# Patient Record
Sex: Female | Born: 1955 | Race: White | Hispanic: No | Marital: Married | Smoking: Former smoker
Health system: Southern US, Community
[De-identification: ages and names within clinical notes are randomized; demographics above are authoritative.]

## PROBLEM LIST (undated history)

## (undated) DIAGNOSIS — M79609 Pain in unspecified limb: Secondary | ICD-10-CM

## (undated) DIAGNOSIS — R202 Paresthesia of skin: Secondary | ICD-10-CM

## (undated) DIAGNOSIS — G629 Polyneuropathy, unspecified: Secondary | ICD-10-CM

## (undated) DIAGNOSIS — M47816 Spondylosis without myelopathy or radiculopathy, lumbar region: Secondary | ICD-10-CM

## (undated) DIAGNOSIS — D472 Monoclonal gammopathy: Secondary | ICD-10-CM

## (undated) DIAGNOSIS — R7309 Other abnormal glucose: Secondary | ICD-10-CM

## (undated) DIAGNOSIS — E7800 Pure hypercholesterolemia, unspecified: Secondary | ICD-10-CM

## (undated) HISTORY — PX: BACK SURGERY: SHX140

## (undated) HISTORY — DX: Spondylosis without myelopathy or radiculopathy, lumbar region: M47.816

## (undated) HISTORY — DX: Monoclonal gammopathy: D47.2

## (undated) HISTORY — PX: TONSILLECTOMY: SUR1361

## (undated) HISTORY — DX: Paresthesia of skin: R20.2

## (undated) HISTORY — DX: Other abnormal glucose: R73.09

## (undated) HISTORY — DX: Pain in unspecified limb: M79.609

## (undated) HISTORY — PX: LUMBAR FUSION: SHX111

## (undated) HISTORY — DX: Pure hypercholesterolemia, unspecified: E78.00

## (undated) HISTORY — DX: Polyneuropathy, unspecified: G62.9

---

## 1999-03-14 ENCOUNTER — Encounter: Payer: Self-pay | Admitting: *Deleted

## 1999-03-14 ENCOUNTER — Ambulatory Visit (HOSPITAL_COMMUNITY): Admission: RE | Admit: 1999-03-14 | Discharge: 1999-03-14 | Payer: Self-pay | Admitting: *Deleted

## 1999-10-29 ENCOUNTER — Ambulatory Visit (HOSPITAL_COMMUNITY): Admission: RE | Admit: 1999-10-29 | Discharge: 1999-10-29 | Payer: Self-pay | Admitting: *Deleted

## 1999-10-29 ENCOUNTER — Encounter: Payer: Self-pay | Admitting: *Deleted

## 1999-11-11 ENCOUNTER — Encounter: Payer: Self-pay | Admitting: *Deleted

## 1999-11-11 ENCOUNTER — Ambulatory Visit (HOSPITAL_COMMUNITY): Admission: RE | Admit: 1999-11-11 | Discharge: 1999-11-11 | Payer: Self-pay | Admitting: *Deleted

## 1999-11-25 ENCOUNTER — Encounter: Payer: Self-pay | Admitting: *Deleted

## 1999-11-25 ENCOUNTER — Ambulatory Visit (HOSPITAL_COMMUNITY): Admission: RE | Admit: 1999-11-25 | Discharge: 1999-11-25 | Payer: Self-pay | Admitting: *Deleted

## 2002-04-05 ENCOUNTER — Encounter: Payer: Self-pay | Admitting: *Deleted

## 2002-04-05 ENCOUNTER — Encounter: Admission: RE | Admit: 2002-04-05 | Discharge: 2002-04-05 | Payer: Self-pay | Admitting: *Deleted

## 2002-12-02 ENCOUNTER — Encounter: Admission: RE | Admit: 2002-12-02 | Discharge: 2002-12-02 | Payer: Self-pay | Admitting: *Deleted

## 2003-03-24 ENCOUNTER — Encounter: Admission: RE | Admit: 2003-03-24 | Discharge: 2003-03-24 | Payer: Self-pay | Admitting: Orthopaedic Surgery

## 2005-06-05 ENCOUNTER — Encounter: Admission: RE | Admit: 2005-06-05 | Discharge: 2005-06-05 | Payer: Self-pay | Admitting: Orthopaedic Surgery

## 2005-06-09 ENCOUNTER — Encounter: Admission: RE | Admit: 2005-06-09 | Discharge: 2005-06-09 | Payer: Self-pay | Admitting: Family Medicine

## 2005-07-16 ENCOUNTER — Inpatient Hospital Stay (HOSPITAL_COMMUNITY): Admission: RE | Admit: 2005-07-16 | Discharge: 2005-07-19 | Payer: Self-pay | Admitting: Orthopaedic Surgery

## 2006-07-07 ENCOUNTER — Ambulatory Visit: Payer: Self-pay | Admitting: Gastroenterology

## 2006-07-20 ENCOUNTER — Ambulatory Visit: Payer: Self-pay | Admitting: Gastroenterology

## 2006-12-03 ENCOUNTER — Encounter: Admission: RE | Admit: 2006-12-03 | Discharge: 2006-12-03 | Payer: Self-pay | Admitting: Family Medicine

## 2007-01-13 ENCOUNTER — Emergency Department (HOSPITAL_COMMUNITY): Admission: EM | Admit: 2007-01-13 | Discharge: 2007-01-13 | Payer: Self-pay | Admitting: Family Medicine

## 2007-04-28 ENCOUNTER — Encounter: Admission: RE | Admit: 2007-04-28 | Discharge: 2007-04-28 | Payer: Self-pay | Admitting: Orthopaedic Surgery

## 2008-10-04 ENCOUNTER — Encounter: Admission: RE | Admit: 2008-10-04 | Discharge: 2008-10-04 | Payer: Self-pay | Admitting: Orthopaedic Surgery

## 2010-02-10 ENCOUNTER — Encounter: Payer: Self-pay | Admitting: Family Medicine

## 2010-02-11 ENCOUNTER — Encounter: Payer: Self-pay | Admitting: Orthopaedic Surgery

## 2010-02-20 ENCOUNTER — Encounter: Payer: Self-pay | Admitting: Family Medicine

## 2010-06-07 NOTE — Discharge Summary (Signed)
Abigail Howard, Abigail Howard               ACCOUNT NO.:  [id number]   MEDICAL RECORD NO.:  [id number]          PATIENT TYPE:  INP   LOCATION:  5016                         FACILITY:  [facility]   PHYSICIAN:  Sharolyn Douglas, M.D.        DATE OF BIRTH:  09/19/1955   DATE OF ADMISSION:  07/16/2005  DATE OF DISCHARGE:  07/19/2005                                 DISCHARGE SUMMARY   ADMITTING DIAGNOSIS:  1. 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  2. Hypercholesterolemia.   DISCHARGE DIAGNOSES:  1. Status post L5-S1 decompression, fusion, doing well.  2. Postoperative blood loss anemia, stable.  3. Postoperative hyponatremia.  4. Postoperative hypokalemia.  5. Hypercholesterolemia.   PROCEDURE:  On July 16, 2005, patient was taken to the operating room for an  L5-S1 posterior spinal fusion with pedicle screws and TLIF.  Also, there was  a repair of an incidental dermal tear intraoperatively and BMP was also  used.   SURGEON:  Sharolyn Douglas, M.D.   ASSISTANT:  Verlin Fester, P.A.   ANESTHESIA:  General.   CONSULTS:  None.   LABS:  CBC with diff preop normal.  H&H __________ reached a low of 9.5 and  27.9 on July 19, 2005.  PT/INR, PTT preop normal.  A complete metabolic  panel preop normal with the exception of glucose at 112.  Postoperatively  basic metabolic panel was monitored x2.  On postoperative day 1, sodium was  134, glucose 158, calcium 7.7, otherwise normal.  On July 18, 2005, sodium  had returned normal at 139, potassium was down at 3.4, glucose of 147, BUN  3, calcium 8; otherwise normal.  UA from preop was negative.  Blood typing  preop type B-negative.  Antibody screen is negative.  X-rays on June 27th of  the chest showed mild diffuse peribronchial thickening.  No evidence of  active pulmonary disease.  Lumbar spine on June 27th showed L5-S1 fusion.  Lumbar spine on July 19, 2005, showed posterior pedicle screws and rods  using L5-S1  interbody plug with normal alignment, no complicating features.   BRIEF HISTORY:  The patient is a 55 year old female who has had a long  history of problems with her back, increasing back into bilateral lower  extremities.  The pain has failed to respond to conservative treatments.  It  has been increasingly interfering with activities of daily living, quality  of life.  Secondary to failure to improve with conservative treatments as  well as her findings on MRI and x-ray, it was thought her best course of  management would be an L5-S1 decompression and posterior spinal fusion.  The  risks and benefits of the procedure were discussed with the patient by Dr.  Noel Gerold as well as myself.  She indicated understanding and opted to proceed.   HOSPITAL COURSE:  On July 16, 2005, the patient was taken to the operating  for the above listed procedures.  She tolerated the procedure well, without  any complications.  She was transferred to the Recovery Room in  stable  condition.   Postoperatively, routine orthopedic spine protocol was followed, and she  progressed along very well.  She was seen on a daily basis by Physical  Therapy and Occupational Therapy, working on her progressive ambulation  program, brace use, back precautions and ADLs.  She progressed along very  well with Physical Therapy and was independent and safe with all the above  prior to discharge.  She did develop mild hyponatremia as well as  hypokalemia through her postoperative course.  This was easily treated with  fluid restriction as well as p.o. potassium supplementation and required no  further treatment.  By July 19, 2005, the patient was orthopedically stable,  had met all goals, and she was also medically stable and ready for discharge  home.   DISCHARGE PLAN:  The patient is a 55 year old female status post L5-S1  posterior spinal fusion doing well.   ACTIVITY:  1. Daily ambulation program, brace on when she is up.   2. Back precautions at all times.  3. No lifting heavier than 5 pounds.  4. Daily dressing change.  5. Keep incision dry till all drainage has stopped.  6. Follow up 2 weeks postoperatively with Dr. Noel Gerold.   MEDICATIONS ON DISCHARGE:  1. Vicodin p.r.n. pain.  2. Robaxin p.r.n. muscle spasm.  3. Multivitamin daily.  4. Calcium daily.  5. Colace twice daily.  6. Laxative as needed.  7. Avoid NSAIDs.   DIET:  Regular home diet as tolerated.   CONDITION ON DISCHARGE:  Stable, improved.   DISPOSITION:  The patient being discharged to her home with her family's  assistance as well as Home Health Physical Therapy and Occupational Therapy.      Verlin Fester, P.A.      Sharolyn Douglas, M.D.  Electronically Signed    CM/MEDQ  D:  09/10/2005  T:  09/10/2005  Job:  [id number]   cc:   Sharolyn Douglas, M.D.

## 2010-06-07 NOTE — H&P (Signed)
Abigail Howard, Abigail Howard               ACCOUNT NO.:  [id number]   MEDICAL RECORD NO.:  [id number]          PATIENT TYPE:  INP   LOCATION:  NA                           FACILITY:  [facility]   PHYSICIAN:  Sharolyn Douglas, M.D.        DATE OF BIRTH:  [birth year]   DATE OF ADMISSION:  07/16/2005  DATE OF DISCHARGE:                                HISTORY & PHYSICAL   CHIEF COMPLAINT:  Low back pain and bilateral lower extremity pain.   HISTORY OF PRESENT ILLNESS:  The patient is a 55 year old female who has  been having problems with her back for some time now.  She was found to have  a severe degenerative disk disease at L5-S1 and associated pain.  She has  failed conservative treatment and it is thought her best course of  management would be a lumbar decompression, as well as fusion at L5-S1.  Risks and benefits have previously been discussed with the patient by Dr.  Noel Gerold, and again discussed with her today by myself.  She indicated  understanding and opts to proceed.   ALLERGIES:  NONE.   MEDICATIONS:  Crestor 5 mg daily.   PAST MEDICAL HISTORY:  Hypercholesterolemia.   PAST SURGICAL HISTORY:  Tonsillectomy as a child.   SOCIAL HISTORY:  The patient is married and denies tobacco use.  Denies  alcohol use.  She has 2  grown children.  Her husband will be available to  help her through her postoperative course, but he does work full-time days.   FAMILY MEDICAL HISTORY:  Noncontributory.   REVIEW OF SYSTEMS:  The patient denies fevers, chills, sweats or bleeding  tendency.  CNS:  Denies blurred vision or double vision, seizures, headaches  or paralysis.  CARDIOVASCULAR:  Denies chest pain, angina, orthopnea,  claudication or palpitations.  PULMONARY:  Denies shortness of breath,  productive cough or hemoptysis.  GI:  Denies nausea, vomiting, constipation,  diarrhea, melena or bloody stools.  GU:  Denies dysuria, hematuria or  discharge.  MUSCULOSKELETAL:  As per HPI.   PHYSICAL EXAMINATION:   VITAL SIGNS:  Pulse is 78 and a regular, respirations  20 unlabored and blood pressure is 110/70.  GENERAL APPEARANCE:  The patient is a 55 year old white female who is alert  and oriented in no acute distress, well-nourished and well-groomed who  appears as stated age.  Pleasant and cooperative with the exam.  HEENT:  Head is normocephalic, atraumatic.  Pupils equal, round and reactive  to light.  Extraocular movements are intact.  Nares are patent.  Pharynx is  clear.  NECK:  Supple to palpation.  No lymphadenopathy, thyromegaly or bruits  appreciated.  CHEST:  Clear to auscultation bilaterally.  No rales, rhonchi, __________  wheezes or friction rubs.  BREASTS:  Not pertinent and not performed.  HEART:  S1 and S2.  Regular rate and rhythm.  No murmurs, rubs or gallops  noted.  ABDOMEN:  Soft to palpation, nontender and nondistended.  No organomegaly  noted.  Positive bowel sounds throughout.  GU:  Not pertinent and not performed.  EXTREMITIES:  As  per HPI.  SKIN:  Intact without any lesions or rashes.   IMPRESSION:  1.  L5-S1 degenerative disk disease with severe pain in her back and lower      extremities.  2.  Hypercholesterolemia.   PLAN:  1.  Admit to [facility] on July 16, 2005 for an L5-S1 laminectomy      and posterior spinal fusion.  This will be done by Dr. Sharolyn Douglas.  2.  The patient's primary care is Financial planner at [facility].  He has forwarded a letter of medical clearance for the      patient to proceed with surgery.  3.  The patient was fitted with a lumbar LSO that will be medically      necessary to her postoperative course.  This was fitted and given to her      at today's office visit.      Verlin Fester, P.A.      Sharolyn Douglas, M.D.  Electronically Signed    CM/MEDQ  D:  07/01/2005  T:  07/01/2005  Job:  [id number]

## 2010-06-07 NOTE — Op Note (Signed)
NAMEJADWIGA, FAIDLEY               ACCOUNT NO.:  [id number]   MEDICAL RECORD NO.:  [id number]          PATIENT TYPE:  INP   LOCATION:  2550                         FACILITY:  [facility]   PHYSICIAN:  Sharolyn Douglas, M.D.        DATE OF BIRTH:  1955/07/16   DATE OF PROCEDURE:  07/16/2005  DATE OF DISCHARGE:                                 OPERATIVE REPORT   PREOPERATIVE DIAGNOSIS:  1.  L5-S1 degenerative disk disease with chronic back pain.  2.  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   POSTOPERATIVE DIAGNOSIS:  1.  L5-S1 degenerative disk disease with chronic back pain.  2.  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   PROCEDURE:  1.  L5-S1 hemilaminectomy and foraminotomy with decompression of the right      L5 and S1 nerve roots.  2.  L5-S1 transforaminal lumbar interbody fusion with placement of 9-mm PEEK      cage.  3.  Pedicle screw instrumentation L5-S1 using the Abbott spine system.  4.  Posterior spinal arthrodesis L5-S1  5.  Local autogenous bone graft supplemented with bone morphogenic protein.   SURGEON:  Sharolyn Douglas, M.D.   ASSISTANTJill Side Mahar, P.A.-C.   ANESTHESIA:  General endotracheal.   ESTIMATED BLOOD LOSS:  400 mL.   COMPLICATIONS:  None.   INDICATIONS:  The patient is a pleasant 55 year old female with chronic  progressive back and right lower extremity pain.  She has relatively  isolated degenerative disk disease at L5-S1 with disk space collapse and  foraminal narrowing.  She has failed to respond to all conservative  treatment options and now elects to undergo L5-S1 decompression and fusion  in hopes of improving her symptoms.  The risks and benefits were reviewed.   PROCEDURE:  The patient was identified in the holding area, taken to the  operating room, placed under general endotracheal anesthesia without  difficulty, given prophylactic IV antibiotics.  She was carefully turned  prone onto the Wilson  frame.  All bony prominences are padded.  Face and  eyes protected all times.  Neural monitoring was established in the form of  SSEPs and lower extremity EMGs.  Her back was prepped and draped in the  usual sterile fashion.  A midline incision was made from L4 down to the  sacrum.  Dissection was carried through the deep fascia.  Subperiosteal  exposure was carried out to the tips of the transverse processes of L5 and  the sacral ala bilaterally.  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  During the exposure, a small dural  tear was encountered between the S1-S2 segment.  The soft tissue over the  interspace between S1 and S2 was removed using a Kerrison punch.  A single 4-  0 Nurolon suture was used to repair the tiny puncture.  We then performed a  Valsalva maneuver and there was a watertight seal.  We then turned our  attention to placing the pedicle screws at L5 and S1 using anatomic probing  technique.  We utilized 6.5 x 45 mm screws in L5 bilaterally and 7.5 x 35 mm  screws in the sacrum bilaterally.  The bone quality was soft.  The screw  purchase was marginal.  We stimulated the screws using triggered EMGs and  there were no deleterious changes.  We then turned our attention to  completing a hemilaminectomy and foraminotomy on the right side at L5-S1.  The L5 and S1 nerve roots were identified and widely decompressed.  The L5  nerve root was found to be encumbered within the L5-S1 foramen secondary to  up/down stenosis and facette hypertrophy.  We felt that we could not  completely decompress the nerve root due to the pedicle of L5 and S1.  We,  therefore, elected to proceed with a transforaminal lumbar interbody fusion.  The remaining facette joint was osteotomized.  The nerve roots were  identified and protected at all times.  Free running EMGs were monitored.  The disk space was entered and completely collapsed.  We could only get a 15   blade into the disk space.  Using bulleted dilators, we were able to dilate  the disk up to 9 mm.  The cartilaginous endplates were scraped using curved  curettes.  Diskectomy completed across the contralateral side.  Distraction  was applied on the pedicle screws.  We then packed the disk space with BMP  sponges along with local bone graft from the laminectomy.  A 9-mm PEEK cage  was packed with a BMP sponge inserted into the interspace, carefully tapped  anteriorly and across the midline.  We had good distraction and felt that  the L5 nerve root was now well decompressed in the foramen.  We then turned  our attention to completing the posterior spinal fusion by decorticating the  L5 and S1 transverse processes bilaterally.  The remaining local bone graft  was packed into the lateral gutters.  40 mm titanium rods were placed in the  polyaxial screw heads.  Gentle compression was applied before shearing off  the locking caps.  We took an interoperative x-ray which showed good  position of the pedicle screws and the PEEK cage.  We then, once again,  evaluated the dural rent which was repaired and repeat Valsalva showed no  leakage.  We then placed Tisseel fibrin glue over the repair along with a  piece of Gelfoam.  The deep fascia was closed with a running #1 Vicryl  suture.  The subcutaneous layer closed with 0 Vicryl and 2-0 Vicryl followed  by a running 3-0 subcuticular Vicryl suture on the skin edges.  Benzoin and  Steri-Strips placed.  A sterile dressing was applied.  The patient was  turned supine, extubated without difficulty, and transferred to recovery in  stable condition.  It should be noted that my assistant helped throughout  the procedure including positioning during the exposure, during the  decompression, using the sucker and retractors, and also with the  instrumentation, fusion and wound closure.      Sharolyn Douglas, M.D. Electronically Signed     MC/MEDQ  D:  07/16/2005   T:  07/16/2005  Job:  [id number]

## 2010-10-25 LAB — POCT URINALYSIS DIP (DEVICE)
Bilirubin Urine: NEGATIVE
Glucose, UA: NEGATIVE
Ketones, ur: NEGATIVE
Nitrite: NEGATIVE

## 2011-05-10 ENCOUNTER — Encounter (HOSPITAL_COMMUNITY): Payer: Self-pay | Admitting: *Deleted

## 2011-05-10 ENCOUNTER — Emergency Department (HOSPITAL_COMMUNITY)
Admission: EM | Admit: 2011-05-10 | Discharge: 2011-05-10 | Disposition: A | Discharge status: Home / Self Care | Payer: 59 | Attending: Emergency Medicine | Admitting: Emergency Medicine

## 2011-05-10 ENCOUNTER — Emergency Department (HOSPITAL_COMMUNITY): Payer: 59

## 2011-05-10 DIAGNOSIS — Y9302 Activity, running: Secondary | ICD-10-CM | POA: Insufficient documentation

## 2011-05-10 DIAGNOSIS — S2239XA Fracture of one rib, unspecified side, initial encounter for closed fracture: Secondary | ICD-10-CM | POA: Insufficient documentation

## 2011-05-10 DIAGNOSIS — R071 Chest pain on breathing: Secondary | ICD-10-CM | POA: Insufficient documentation

## 2011-05-10 DIAGNOSIS — M549 Dorsalgia, unspecified: Secondary | ICD-10-CM | POA: Insufficient documentation

## 2011-05-10 DIAGNOSIS — S2231XA Fracture of one rib, right side, initial encounter for closed fracture: Secondary | ICD-10-CM

## 2011-05-10 DIAGNOSIS — W010XXA Fall on same level from slipping, tripping and stumbling without subsequent striking against object, initial encounter: Secondary | ICD-10-CM | POA: Insufficient documentation

## 2011-05-10 DIAGNOSIS — R109 Unspecified abdominal pain: Secondary | ICD-10-CM | POA: Insufficient documentation

## 2011-05-10 LAB — URINALYSIS, ROUTINE W REFLEX MICROSCOPIC
Leukocytes, UA: NEGATIVE
Nitrite: NEGATIVE
Specific Gravity, Urine: 1.025 (ref 1.005–1.030)
Urobilinogen, UA: 0.2 mg/dL (ref 0.0–1.0)

## 2011-05-10 LAB — URINE MICROSCOPIC-ADD ON

## 2011-05-10 MED ORDER — OXYCODONE-ACETAMINOPHEN 5-325 MG PO TABS
1.0000 | ORAL_TABLET | Freq: Once | ORAL | Status: AC
  Administered 2011-05-10: 1 via ORAL
  Filled 2011-05-10: qty 1

## 2011-05-10 MED ORDER — ONDANSETRON HCL 4 MG PO TABS
4.0000 mg | ORAL_TABLET | Freq: Once | ORAL | Status: AC
  Administered 2011-05-10: 4 mg via ORAL
  Filled 2011-05-10: qty 1

## 2011-05-10 MED ORDER — BACLOFEN 10 MG PO TABS: 10.0000 mg | ORAL_TABLET | Freq: Three times a day (TID) | ORAL | Status: AC

## 2011-05-10 MED ORDER — KETOROLAC TROMETHAMINE 10 MG PO TABS
10.0000 mg | ORAL_TABLET | Freq: Once | ORAL | Status: AC
Start: 2011-05-10 — End: 2011-05-10
  Administered 2011-05-10: 10 mg via ORAL
  Filled 2011-05-10: qty 1

## 2011-05-10 MED ORDER — IBUPROFEN 800 MG PO TABS: 800.0000 mg | ORAL_TABLET | Freq: Three times a day (TID) | ORAL | Status: AC

## 2011-05-10 MED ORDER — METHOCARBAMOL 500 MG PO TABS
1000.0000 mg | ORAL_TABLET | Freq: Once | ORAL | Status: AC
  Administered 2011-05-10: 1000 mg via ORAL
  Filled 2011-05-10: qty 2

## 2011-05-10 MED ORDER — OXYCODONE-ACETAMINOPHEN 5-325 MG PO TABS: 1.0000 | ORAL_TABLET | ORAL | Status: AC | PRN

## 2011-05-10 NOTE — ED Notes (Signed)
Pt DC to home with steady gait and prescriptions in hand 

## 2011-05-10 NOTE — ED Notes (Signed)
Pt states competed in a "mud run today, fell landing on right rib, heard a crack". Pt c/o increased pain with breathing. Denies SOB.

## 2011-05-10 NOTE — ED Provider Notes (Signed)
History     CSN: [id number]  Arrival date & time 05/10/11  6440   First MD Initiated Contact with Patient 05/10/11 1933      Chief Complaint  Patient presents with  . Fall  . Flank Pain    (Consider location/radiation/quality/duration/timing/severity/associated sxs/prior treatment) HPI Comments: Pt states she was participating in a "mud run", fell and landed on thke right rib. She heard a crack-like noise. She is having increasing pain with breathing, but not SOB. No LOC. No other injury. She is not on blood thinning medications.  Patient is a 56 y.o. female presenting with fall and flank pain. The history is provided by the patient.  Fall Pertinent negatives include no abdominal pain and no hematuria.  Flank Pain Pertinent negatives include no abdominal pain, arthralgias, chest pain, coughing or neck pain.    History reviewed. No pertinent past medical history.  Past Surgical History  Procedure Date  . Back surgery   . Tonsillectomy     No family history on file.  History  Substance Use Topics  . Smoking status: Former Games developer  . Smokeless tobacco: Not on file  . Alcohol Use: No    OB History    Grav Para Term Preterm Abortions TAB SAB Ect Mult Living                  Review of Systems  Constitutional: Negative for activity change.       All ROS Neg except as noted in HPI  HENT: Negative for nosebleeds and neck pain.   Eyes: Negative for photophobia and discharge.  Respiratory: Negative for cough, shortness of breath and wheezing.   Cardiovascular: Negative for chest pain and palpitations.  Gastrointestinal: Negative for abdominal pain and blood in stool.  Genitourinary: Positive for flank pain. Negative for dysuria, frequency and hematuria.  Musculoskeletal: Positive for back pain. Negative for arthralgias.  Skin: Negative.   Neurological: Negative for dizziness, seizures and speech difficulty.  Psychiatric/Behavioral: Negative for hallucinations and  confusion.    Allergies  Review of patient's allergies indicates no known allergies.  Home Medications  No current outpatient prescriptions on file.  BP 122/57  Pulse 93  Temp(Src) 97.9 F (36.6 C) (Oral)  Resp 20  Ht 5\' 6"  (1.676 m)  Wt 193 lb (87.544 kg)  BMI 31.15 kg/m2  SpO2 100%  Physical Exam  Nursing note and vitals reviewed. Constitutional: She is oriented to person, place, and time. She appears well-developed and well-nourished.  Non-toxic appearance.  HENT:  Head: Normocephalic.  Right Ear: Tympanic membrane and external ear normal.  Left Ear: Tympanic membrane and external ear normal.  Eyes: EOM and lids are normal. Pupils are equal, round, and reactive to light.  Neck: Normal range of motion. Neck supple. Carotid bruit is not present.  Cardiovascular: Normal rate, regular rhythm, normal heart sounds, intact distal pulses and normal pulses.   Pulmonary/Chest: Breath sounds normal. No respiratory distress.       Anterior/lateral right chest wall tenderness to palpation and ROM.  No bruising. No palpable deformity.  Abdominal: Soft. Bowel sounds are normal. There is no tenderness. There is no guarding.  Musculoskeletal: Normal range of motion.  Lymphadenopathy:       Head (right side): No submandibular adenopathy present.       Head (left side): No submandibular adenopathy present.    She has no cervical adenopathy.  Neurological: She is alert and oriented to person, place, and time. She has normal strength. No cranial  nerve deficit or sensory deficit.  Skin: Skin is warm and dry.  Psychiatric: She has a normal mood and affect. Her speech is normal.    ED Course  Procedures (including critical care time)  Labs Reviewed  URINALYSIS, ROUTINE W REFLEX MICROSCOPIC - Abnormal; Notable for the following:    Hgb urine dipstick TRACE (*)    All other components within normal limits  URINE MICROSCOPIC-ADD ON - Abnormal; Notable for the following:    Squamous  Epithelial / LPF FEW (*)    All other components within normal limits   Dg Ribs Unilateral W/chest Right  05/10/2011  *RADIOLOGY REPORT*  Clinical Data: Fall  RIGHT RIBS AND CHEST - 3+ VIEW  Comparison: Chest radiographs dated 07/16/2005  Findings: Lungs are clear. No pleural effusion or pneumothorax.  Cardiomediastinal silhouette is within normal limits.  Suspected right lateral 6th rib fracture.  Lower lumbar spine fixation hardware, incompletely visualized.  IMPRESSION: Suspected right lateral 6th rib fracture.  Original Report Authenticated By: Charline Bills, M.D.   Pulse Ox 100% on room air. WNL by my interpretation.  No diagnosis found.    MDM  I have reviewed nursing notes, vital signs, and all appropriate lab and imaging results for this patient. Pt sustained a fall from a standing position today. She has a fracture of the right 6th rib. Pt advised to take frequent deep breaths. Rx for baclofen, ibuprofen, and oxycodone  Given to pt.       Kathie Dike, [place] 05/10/11 2059

## 2011-05-10 NOTE — Discharge Instructions (Signed)
Your xrays are consistent with a fracture of the right 6th rib. Please practice taking several deep breaths each hour. Please use Ibuprofen and baclofen  three times daily after a meal. Please use Percocet for pain if needed. Please use chocolate pudding or stool softener when taking percocet. Please see your MD or MD at the [facility] for follow up and recheck of your fracture.Rib Fracture Your caregiver has diagnosed you as having a rib fracture (a break). This can occur by a blow to the chest, by a fall against a hard object, or by violent coughing or sneezing. There may be one or many breaks. Rib fractures may heal on their own within 3 to 8 weeks. The longer healing period is usually associated with a continued cough or other aggravating activities. HOME CARE INSTRUCTIONS   Avoid strenuous activity. Be careful during activities and avoid bumping the injured rib. Activities that cause pain pull on the fracture site(s) and are best avoided if possible.   Eat a normal, well-balanced diet. Drink plenty of fluids to avoid constipation.   Take deep breaths several times a day to keep lungs free of infection. Try to cough several times a day, splinting the injured area with a pillow. This will help prevent pneumonia.   Do not wear a rib belt or binder. These restrict breathing which can lead to pneumonia.   Only take over-the-counter or prescription medicines for pain, discomfort, or fever as directed by your caregiver.  SEEK MEDICAL CARE IF:  You develop a continual cough, associated with thick or bloody sputum. SEEK IMMEDIATE MEDICAL CARE IF:   You have a fever.   You have difficulty breathing.   You have nausea (feeling sick to your stomach), vomiting, or abdominal (belly) pain.   You have worsening pain, not controlled with medications.  Document Released: 01/06/2005 Document Revised: 12/26/2010 Document Reviewed: 06/10/2006 [facility] Patient Information 2012 [place], [place].

## 2011-05-10 NOTE — ED Provider Notes (Signed)
Medical screening examination/treatment/procedure(s) were performed by non-physician practitioner and as supervising physician I was immediately available for consultation/collaboration.   Collyn Selk L Maksim Peregoy, MD 05/10/11 2350 

## 2012-02-27 ENCOUNTER — Telehealth: Payer: Self-pay | Admitting: Oncology

## 2012-02-27 NOTE — Telephone Encounter (Signed)
S/W PT IN REF TO NP APPT. ON 03/10/12@11 :00 REFERRING DR LOVE DX-MGUS PROTEIN MIALED NP PACKET

## 2012-02-27 NOTE — Telephone Encounter (Signed)
C/D 02/27/12 for appt.03/10/12

## 2012-03-01 ENCOUNTER — Encounter: Payer: Self-pay | Admitting: Oncology

## 2012-03-01 ENCOUNTER — Other Ambulatory Visit: Payer: Self-pay | Admitting: Oncology

## 2012-03-01 DIAGNOSIS — D472 Monoclonal gammopathy: Secondary | ICD-10-CM

## 2012-03-01 DIAGNOSIS — G629 Polyneuropathy, unspecified: Secondary | ICD-10-CM

## 2012-03-01 HISTORY — DX: Polyneuropathy, unspecified: G62.9

## 2012-03-01 HISTORY — DX: Monoclonal gammopathy: D47.2

## 2012-03-02 ENCOUNTER — Telehealth: Payer: Self-pay | Admitting: Oncology

## 2012-03-02 NOTE — Telephone Encounter (Signed)
Called pt and left message regarding lab appt before MD visit on 3/19

## 2012-03-04 ENCOUNTER — Ambulatory Visit: Payer: 59

## 2012-03-04 ENCOUNTER — Other Ambulatory Visit: Payer: 59 | Admitting: Lab

## 2012-03-08 ENCOUNTER — Encounter: Payer: Self-pay | Admitting: Oncology

## 2012-03-08 ENCOUNTER — Ambulatory Visit: Payer: 59

## 2012-03-08 ENCOUNTER — Other Ambulatory Visit (HOSPITAL_BASED_OUTPATIENT_CLINIC_OR_DEPARTMENT_OTHER): Payer: 59 | Admitting: Lab

## 2012-03-08 DIAGNOSIS — G609 Hereditary and idiopathic neuropathy, unspecified: Secondary | ICD-10-CM

## 2012-03-08 DIAGNOSIS — G629 Polyneuropathy, unspecified: Secondary | ICD-10-CM

## 2012-03-08 DIAGNOSIS — D472 Monoclonal gammopathy: Secondary | ICD-10-CM

## 2012-03-08 LAB — CBC & DIFF AND RETIC
BASO%: 0.6 % (ref 0.0–2.0)
EOS%: 1.4 % (ref 0.0–7.0)
Eosinophils Absolute: 0.1 10*3/uL (ref 0.0–0.5)
LYMPH%: 32 % (ref 14.0–49.7)
MCH: 28.7 pg (ref 25.1–34.0)
MCHC: 33.4 g/dL (ref 31.5–36.0)
MCV: 85.9 fL (ref 79.5–101.0)
MONO%: 8.2 % (ref 0.0–14.0)
Platelets: 231 10*3/uL (ref 145–400)
RBC: 4.95 10*6/uL (ref 3.70–5.45)
Retic %: 1.86 % (ref 0.70–2.10)

## 2012-03-08 LAB — MORPHOLOGY

## 2012-03-08 NOTE — Progress Notes (Signed)
Checked in new patient. No financial issues. °

## 2012-03-10 ENCOUNTER — Other Ambulatory Visit: Payer: 59 | Admitting: Lab

## 2012-03-10 ENCOUNTER — Ambulatory Visit: Payer: 59

## 2012-03-10 ENCOUNTER — Ambulatory Visit (HOSPITAL_BASED_OUTPATIENT_CLINIC_OR_DEPARTMENT_OTHER): Payer: 59 | Admitting: Oncology

## 2012-03-10 ENCOUNTER — Encounter: Payer: Self-pay | Admitting: Oncology

## 2012-03-10 VITALS — BP 151/85 | HR 66 | Temp 98.5°F | Resp 20 | Ht 66.0 in | Wt 200.2 lb

## 2012-03-10 DIAGNOSIS — D472 Monoclonal gammopathy: Secondary | ICD-10-CM

## 2012-03-10 DIAGNOSIS — G609 Hereditary and idiopathic neuropathy, unspecified: Secondary | ICD-10-CM

## 2012-03-10 DIAGNOSIS — M47816 Spondylosis without myelopathy or radiculopathy, lumbar region: Secondary | ICD-10-CM | POA: Insufficient documentation

## 2012-03-10 HISTORY — DX: Spondylosis without myelopathy or radiculopathy, lumbar region: M47.816

## 2012-03-10 LAB — KAPPA/LAMBDA LIGHT CHAINS
Kappa free light chain: 0.9 mg/dL (ref 0.33–1.94)
Lambda Free Lght Chn: 1 mg/dL (ref 0.57–2.63)

## 2012-03-10 NOTE — Progress Notes (Signed)
New Patient Hematology-Oncology Evaluation   Abigail Howard [id number] Sep 12, 1955 57 y.o. 03/10/2012  CC: Dr. Fayrene Fearing love; Dr. Antony Haste   Reason for referral:  Findings of trace monoclonal IgG lambda paraprotein on immunofixation electrophoresis in a lady with long-standing idiopathic peripheral neuropathy.   HPI:  57 year old woman who has been in overall excellent health without any major medical or surgical illness. She had back surgery with an L5-S1 fusion in 2007. About a year later she started to notice numbness in her right leg below the knee. This persisted. She was reevaluated by her orthopedic surgeon and told that these symptoms were unrelated to the surgery. Details of these discussions not available at time of this dictation. I found a MRI of the thoracic spine done 04/28/2007. 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 Previous studies of the lumbar spine done 26-Jun-2005 showed stable degenerative disc disease L5-S1 with annular disc bulging and osteophyte formation. There was inferior foraminal narrowing bilaterally with possible bilateral L5 nerve root encroachment.  She was referred to neurology. She had an extensive evaluation including EMG and nerve conduction studies done 12/03/2007 which was normal. CT scan of the lumbar spine done 10/04/2008 showed a good posterior fusion with no other significant abnormalities. She had  nerve biopsy (peroneal?) but I do not find the report in the system. About one year ago she started to get paresthesias below the knee on the left leg as well as persistent symptoms on the right.  She was reevaluated by neurology and had a number of additional lab tests done. On 01/19/2012 total serum immunoglobulins all normal:  IgG 888 mg percent  ([id number]), IgA 160 ( 91-414), IgM 100 (40-2:30. No M spike noted on serum protein electrophoresis. However immunofixation electropheresis  showed IgG monoclonal protein with lambda light chain specificity. Protein electrophoresis of the urine and immunofixation electrophoresis did not show any monoclonal proteins. A 24-hour urine showed a total of only 49.5 mg of protein. She has normal renal function with BUN 11, creatinine 0.7. Serum total protein normal 6.7 g percent with albumin normal at 4.4 g percent. ESR 6 mm. B12 701. RPR negative.  A heavy metal screen showed detectable levels of lead, mercury, and arsenic but all in a nontoxic range. She tells me tests for hepatitis were done but she has not heard results yet.  Additional studies done in anticipation of today's visit through my office included a normal CBC with a hemoglobin of 14, hematocrit 42.5%, MCV 85.9, white count 6500, 58% neutrophils, 32% lymphocytes, 8% monocytes, platelet count 231,000. Reticulocyte count 1.9%. Serum free kappa light chains 0.9 mg percent, lambda 1.0, ratio 0.90.   PMH: Past Medical History  Diagnosis Date  . MGUS (monoclonal gammopathy of unknown significance) 03/01/2012  . Neuropathy, peripheral 03/01/2012    Past Surgical History  Procedure Laterality Date  . Back surgery    . Tonsillectomy      Allergies: No Known Allergies  Medications: Crestor 5 mg daily. Neurontin 300 mg twice a day which she's been on for a year but not giving her any relief of her symptoms.  Social History:  She is married. She works for [employer]. She denies any toxic or chemical exposures. She has a son 55 and a daughter 86 both healthy. She stopped smoking 15 years ago. She has a rare alcoholic beverage. More this week when our office called and said she had an appointment at the "  cancer center." Which scared the heck out of her.  Family History: Parents are both alive mother 26 father 20 both with coronary artery disease. She has a brother age 76 of an MI in his 33s and had bypass surgery.  Review of Systems: Constitutional symptoms: No  constitutional symptoms HEENT: No sore throat Respiratory: No cough or dyspnea Cardiovascular:  No chest pain or palpitations Gastrointestinal ROS: No change in bowel habit. No hematochezia or melena. Genito-Urinary ROS: No hematuria. No vaginal bleeding Hematological and Lymphatic: Musculoskeletal: No myalgias or arthralgias. Neurologic: No headache or change in vision. No upper extremity paresthesias Dermatologic: No rash or ecchymosis Remaining ROS negative.  Physical Exam: Blood pressure 151/85, pulse 66, temperature 98.5 F (36.9 C), temperature source Oral, resp. rate 20, height 5\' 6"  (1.676 m), weight 200 lb 3.2 oz (90.81 kg). Wt Readings from Last 3 Encounters:  03/10/12 200 lb 3.2 oz (90.81 kg)  05/10/11 193 lb (87.544 kg)    General appearance: Anxious and tearful Caucasian woman Head: Normal, no periorbital edema or purpura Neck: Normal  Throat: No erythema or exudate, no thick tongue Lymph nodes: No adenopathy Breasts: Lungs: Clear to auscultation resonant to percussion Heart: Regular rhythm no murmur Abdominal: Soft, nontender, no mass, no organomegaly GU: Not examined Extremities: No edema, no calf tenderness Vascular carotids 2+ no bruits, dorsalis pedis pulses 1+ symmetric, right posterior tibial pulse 1+ left nonpalpable Neurologic: Mental status intact, cranial nerves intact, motor strength 5 over 5, PERRLA, optic discs sharp, vessels normal, sensation is intact to pinprick and vibration over the fingertips. Intact to pinprick over the feet. Mild decrease in vibration sensation over the dorsa of the feet to tuning fork exam Skin: No rash or ecchymosis    Lab Results: Lab Results  Component Value Date   WBC 6.5 03/08/2012   HGB 14.2 03/08/2012   HCT 42.5 03/08/2012   MCV 85.9 03/08/2012   PLT 231 03/08/2012     Chemistry   No results found for this basename: NA, K, CL, CO2, BUN, CREATININE, GLU   No results found for this basename: CALCIUM, ALKPHOS, AST,  ALT, BILITOT       Pathology: Not available. Peripheral nerve biopsy per patient history.   Review of peripheral blood film: Normochromic normocytic red cells. Mature neutrophils and lymphocytes with occasional benign reactive lymphocyte. Normal platelets.   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 Trace amount of monoclonal protein on IFE with normal total immunoglobulin levels. No monoclonal protein in the urine. No anemia, hypercalcemia, hypoalbuminemia, no elevation of serum free light chains.  I don't think that this trace amount of monoclonal protein is in any way related to her peripheral neuropathy. I would still consider that there may be scar formation causing nerve entrapment from previous lumbar surgery as the etiology of her complaints.  She is reassured that she does not have multiple myeloma. Only recommendation is to check lab work on an annual basis.  I will see her again in one year with lab in advance of the visit.Marland Kitchen     Levert Feinstein, MD 03/10/2012, 1:41 PM

## 2012-03-10 NOTE — Patient Instructions (Signed)
See you back in 1 year

## 2012-05-28 ENCOUNTER — Ambulatory Visit: Payer: Self-pay | Admitting: Neurology

## 2012-06-01 ENCOUNTER — Encounter: Payer: Self-pay | Admitting: Neurology

## 2012-06-01 ENCOUNTER — Ambulatory Visit: Payer: Self-pay | Admitting: Neurology

## 2012-06-01 DIAGNOSIS — E7800 Pure hypercholesterolemia, unspecified: Secondary | ICD-10-CM

## 2012-06-01 DIAGNOSIS — R202 Paresthesia of skin: Secondary | ICD-10-CM

## 2012-06-01 DIAGNOSIS — R7309 Other abnormal glucose: Secondary | ICD-10-CM | POA: Insufficient documentation

## 2012-06-01 DIAGNOSIS — M79609 Pain in unspecified limb: Secondary | ICD-10-CM

## 2012-10-08 ENCOUNTER — Telehealth: Payer: Self-pay | Admitting: Neurology

## 2012-10-08 NOTE — Telephone Encounter (Signed)
Patient was watching TV. She heard the announcement about a lawsuit for patient who had been treated with abx Cipro. She was wondering if she had been diagnosed with neuropathy by Dr. Sandria Manly. Per patient's chart Paresthesia, rt leg pain, monoclonal gammopathy. No neuropathy, she will follow up with Dr Terrace Arabia.

## 2012-10-08 NOTE — Telephone Encounter (Signed)
Spoke with patient in regards Neuropathy. She wanted to know if she had been dx by Dr. Sandria Manly.

## 2012-11-18 ENCOUNTER — Telehealth: Payer: Self-pay | Admitting: Oncology

## 2012-11-18 NOTE — Telephone Encounter (Signed)
Pt called and schedule lab and MD on February 2015

## 2012-12-09 IMAGING — CR DG RIBS W/ CHEST 3+V*R*
4 series · 4 of 4 positions shown · non-contrast
Comparison: Chest radiographs dated 07/16/2005

CLINICAL DATA: Fall

RIGHT RIBS AND CHEST - 3+ VIEW

[view not recorded (1 of 4)]
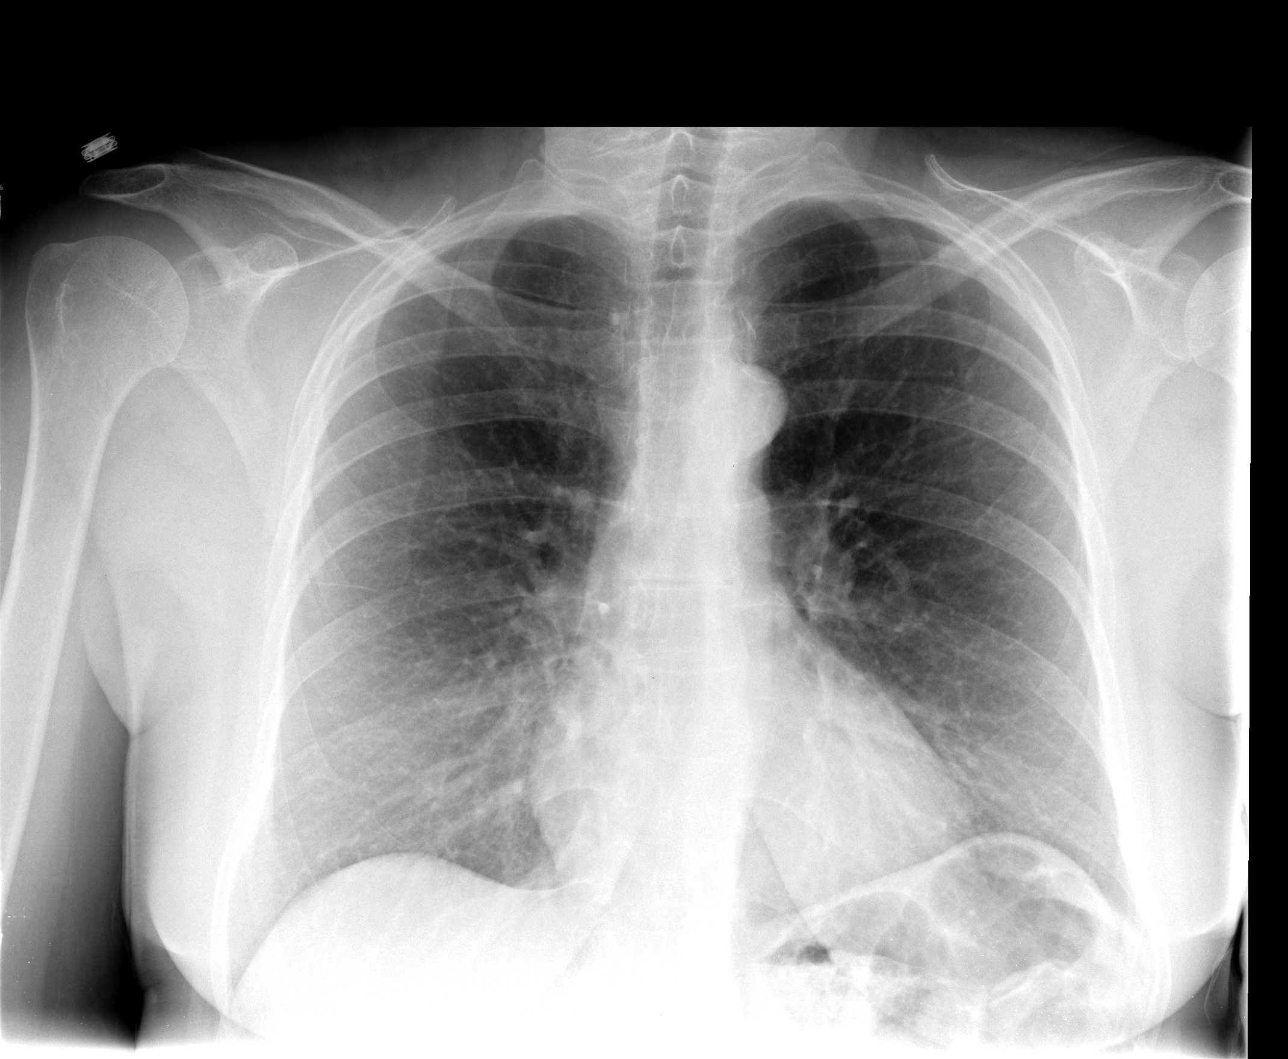

[view not recorded (2 of 4)]
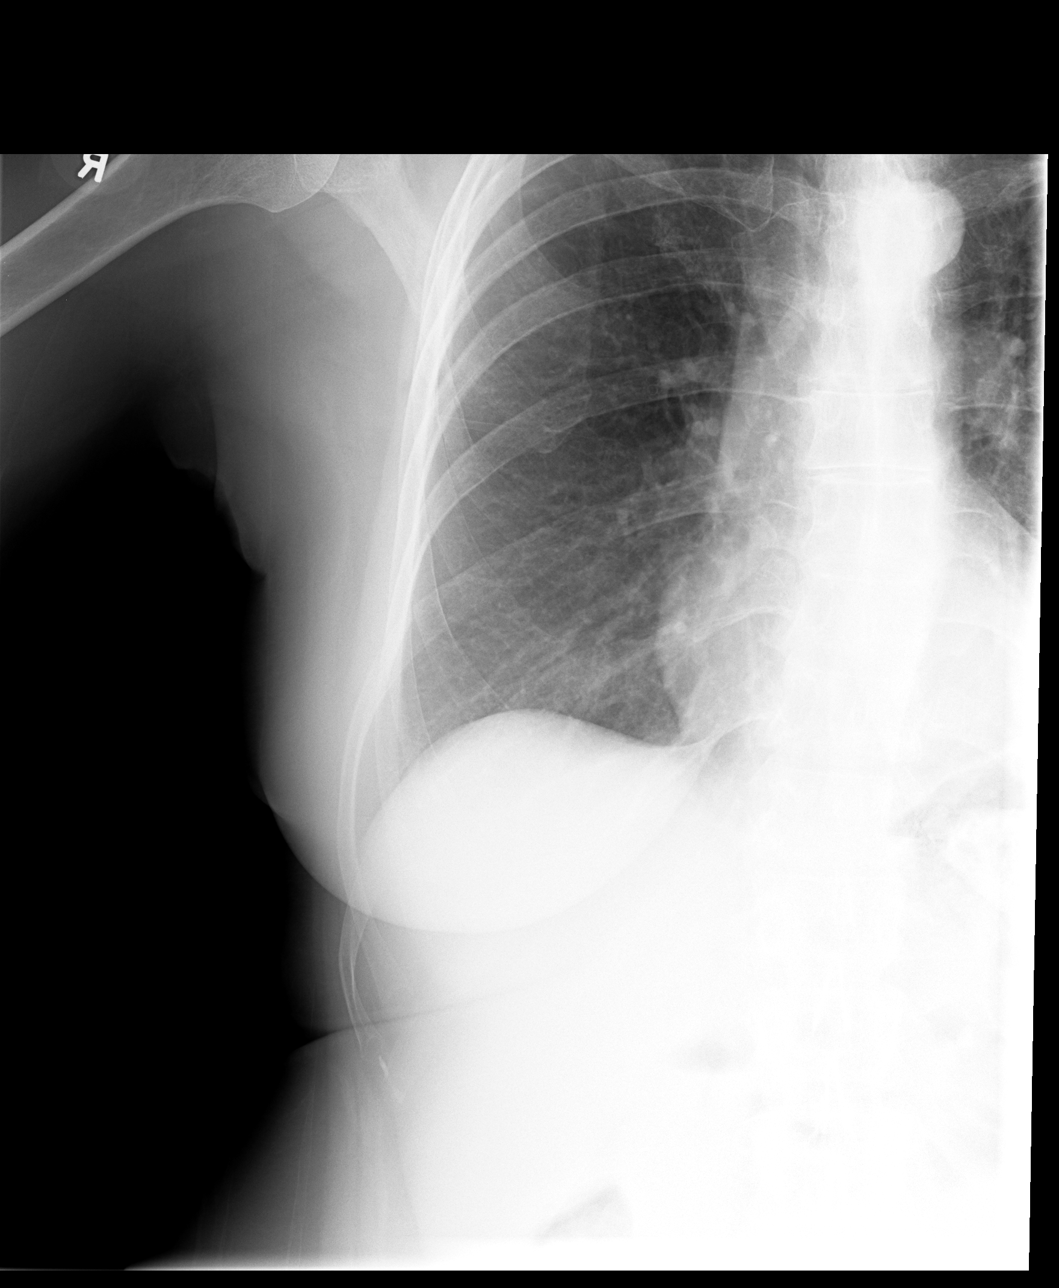

[view not recorded (3 of 4)]
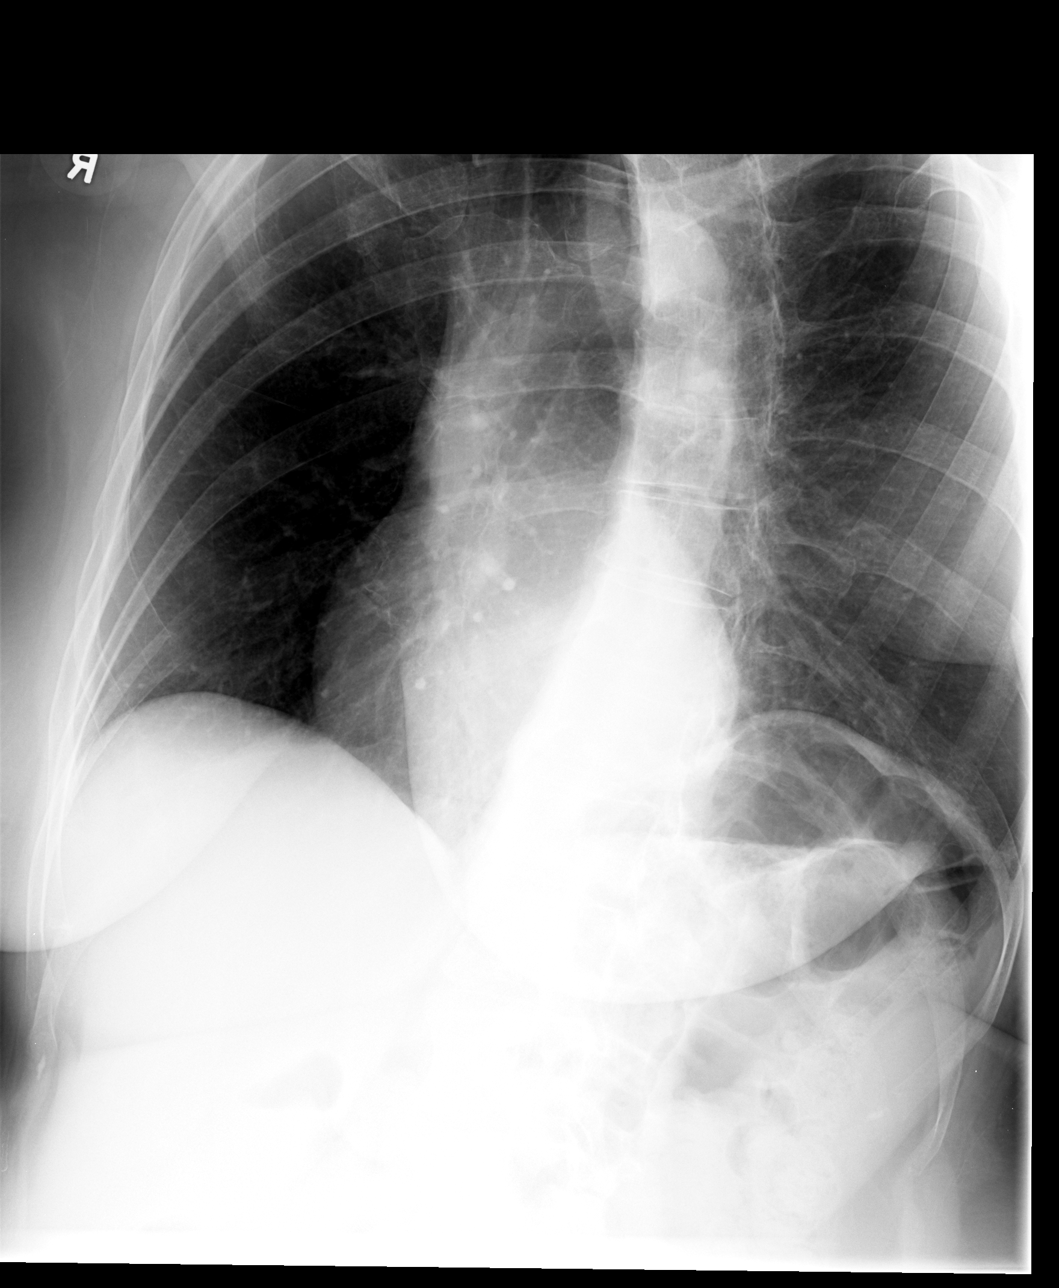

[view not recorded (4 of 4)]
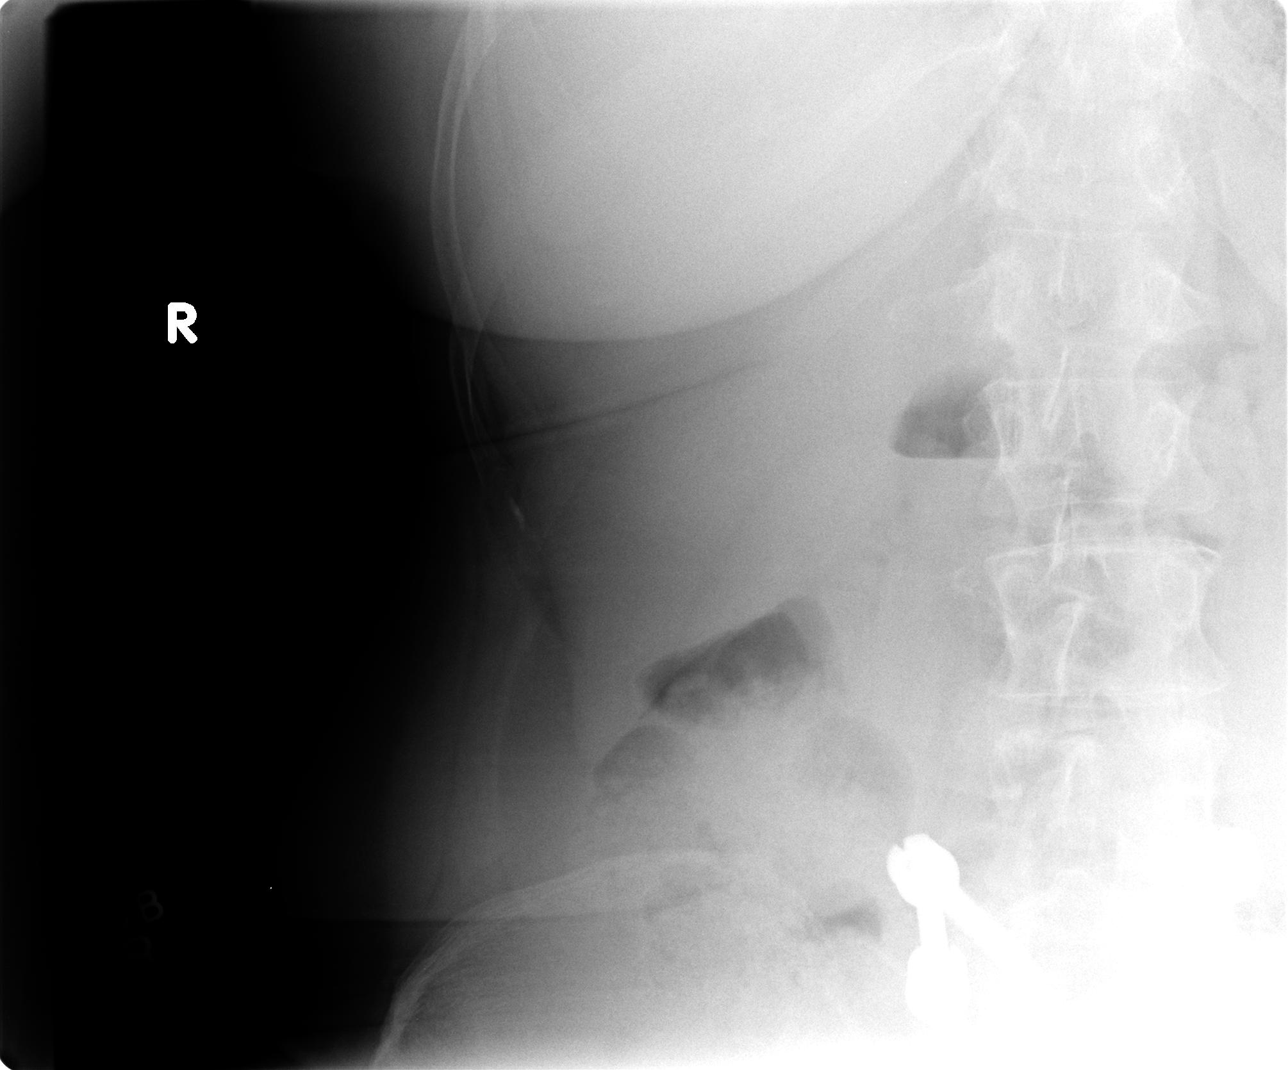

[4 of 4 positions shown; findings below may reference images not displayed]

FINDINGS: Lungs are clear. No pleural effusion or pneumothorax.

Cardiomediastinal silhouette is within normal limits.

Suspected right lateral 6th rib fracture.

Lower lumbar spine fixation hardware, incompletely visualized.
IMPRESSION: Suspected right lateral 6th rib fracture.

## 2012-12-27 ENCOUNTER — Ambulatory Visit: Payer: Self-pay | Admitting: Neurology

## 2013-01-21 ENCOUNTER — Ambulatory Visit: Payer: 59 | Admitting: Oncology

## 2013-02-02 ENCOUNTER — Telehealth: Payer: Self-pay | Admitting: *Deleted

## 2013-02-02 NOTE — Telephone Encounter (Signed)
Received call from pt stating "I've moved to [place], may be permanent, going through a divorce."  Had appt for lab 02/28/13 and MD 03/04/13; "could I get labs drawn here [facility]) from [facility] and have them fax results to Dr. Beryle Beams?"  Note to MD.

## 2013-02-02 NOTE — Telephone Encounter (Signed)
Per Dr. Beryle Beams; notified pt that MD office will fax orders for lab draw to [facility] gave Fax# as [phone].

## 2013-02-28 ENCOUNTER — Other Ambulatory Visit: Payer: 59

## 2013-03-02 ENCOUNTER — Telehealth: Payer: Self-pay | Admitting: *Deleted

## 2013-03-02 NOTE — Telephone Encounter (Signed)
Pt called for report of lab results.  These were done in [facility] & lab results were supposed to be faxed to Dr Beryle Beams.  Will check for results & encouraged pt to check with lab to see if they were faxed to [place]. She can be reached at [phone] mobile #.

## 2013-03-04 ENCOUNTER — Ambulatory Visit: Payer: 59 | Admitting: Oncology

## 2013-03-08 ENCOUNTER — Telehealth: Payer: Self-pay | Admitting: *Deleted

## 2013-03-08 NOTE — Telephone Encounter (Signed)
Left message for patient to call back regarding lab results.

## 2013-03-19 ENCOUNTER — Encounter: Payer: Self-pay | Admitting: Oncology

## 2013-04-01 ENCOUNTER — Encounter: Payer: Self-pay | Admitting: Oncology

## 2013-04-01 NOTE — Progress Notes (Signed)
58 year old woman I evaluated last year for neuropathy with findings of a trace amount of monoclonal lambda protein seen on an immunofixation electrophoresis. She had no organomegaly or adenopathy on exam. She was not anemic. Total serum immunoglobulins were normal. Free light chain ratio was normal. No monoclonal proteins in the urine. I felt she had the "monoclonal gammopathy of undetermined significance" Recommended annual labs. She was unable to make her appointment this year but he did get labs done through lab [facility] down in [place]. Labs dated 02/28/2013 identical to last year with a hemoglobin 14.8, normal serum total immunoglobulins, monoclonal IgG lambda light chain only on an immunofixation electrophoresis of the serum, normal serum free kappa and lambda light chains both total and a ratio  Impression:  MGUS Recommendation: Continued annual lab assessment. She does not need to followup in our office unless pattern of her labs change in the future

## 2013-04-08 ENCOUNTER — Telehealth: Payer: Self-pay | Admitting: *Deleted

## 2013-04-08 NOTE — Telephone Encounter (Addendum)
Notified pt per Dr Beryle Beams that labs same as last year:  No anemia, normal total antibody levels, trace amount of monoclonal antibody of no significance.  She can get lab by primary care once a year & no need to see Dr. Beryle Beams unless there is a change.  She states that she will be in [facility] Monday & would like a copy of labs & a copy will also be faxed to Dr Anastasia Pall @ [phone].  She wants to know if the same labs need to be repeated next year.  Per Dr Beryle Beams, same labs.  Script ready for pt to p/u Monday for cbc with diff, immunofixation electrophoresis with QIG, kappa/Lamda light chains in 1 year & fax results to [facility] @ [phone] as well as a copy of labs.

## 2013-04-20 ENCOUNTER — Telehealth: Payer: Self-pay | Admitting: *Deleted

## 2013-04-20 NOTE — Telephone Encounter (Signed)
Patient called and left message.  She was unable to come by and pick up script for labs and requested that we mail it to her at:[address].  [place] [phone]. Script mailed.

## 2013-05-03 ENCOUNTER — Encounter: Payer: Self-pay | Admitting: Oncology

## 2013-05-18 ENCOUNTER — Telehealth: Payer: Self-pay | Admitting: *Deleted

## 2013-05-18 NOTE — Telephone Encounter (Signed)
Patient called requesting to talk with someone about her lab work and diagnosis.  Received script for lab work.  "What is Dr. Beryle Beams looking for?  Could someone give me my diagnosis so I may Google what it is to get a better understanding."  Denies receiving letter of provider move to [facility].  Offered to give Dr. Azucena Freed new office number.  Reports she is living in [place] and asked if she should obtain a new doctor.  Comes to and from [place] to [place].  Explained the lab test are to monitor for any accumulation or changes in bone marrow to make sure of no progression to a cancer.  Asked to verify address with this call. Changed address to [address], [place], [place]., [place].  Phone number [phone] we already have on file for mobile number.      Noted phone note dated 04-08-2013 about the script with lab orders is for results to be sent to Dr. Azucena Freed new office.  Called [name], left message on voice mail with this information, indicating no provider re-assignment is needed.  Will route this call information to provider.    Called [name] a second after operator reports she returned call.  "Google subscriber not available."  Message left with the [phone] number to reach Dr. Beryle Beams with [facility].

## 2014-05-02 ENCOUNTER — Telehealth: Payer: Self-pay | Admitting: *Deleted

## 2014-05-02 NOTE — Telephone Encounter (Signed)
Message from pt - pt requesting lab results; she lives in [place] and results have been faxed to the office. Telephone # [phone]. Thanks

## 2014-05-03 NOTE — Telephone Encounter (Signed)
Called pt again - stated she had called the office (in [place]) to fax labs to our office. We have not received any labs. Stated she will call their office to have them re-fax labs. Thanks

## 2014-05-03 NOTE — Telephone Encounter (Signed)
Call pt: we have received no data on her since 2/15

## 2014-05-05 NOTE — Telephone Encounter (Signed)
Called / informed pt labs normal per Dr Beryle Beams. She wants to know if she needs to be continued  monitored yearly?

## 2014-05-05 NOTE — Telephone Encounter (Signed)
No

## 2014-05-05 NOTE — Telephone Encounter (Signed)
Lab arrived: everything normal!

## 2014-05-05 NOTE — Telephone Encounter (Signed)
Per [place], labs have arrived - they were sent upstairs.

## 2014-05-08 NOTE — Telephone Encounter (Signed)
Pt called informed she does not need yearly monitoring per Dr Beryle Beams.

## 2018-03-31 ENCOUNTER — Telehealth: Payer: Self-pay | Admitting: *Deleted
